# Patient Record
Sex: Female | Born: 1980 | Race: Black or African American | Hispanic: No | Marital: Single
Health system: Southern US, Community
[De-identification: ages and names within clinical notes are randomized; demographics above are authoritative.]

---

## 2013-07-26 ENCOUNTER — Ambulatory Visit: Payer: Self-pay

## 2013-07-26 LAB — URINALYSIS, COMPLETE
BILIRUBIN, UR: NEGATIVE
GLUCOSE, UR: NEGATIVE mg/dL (ref 0–75)
NITRITE: NEGATIVE
PH: 6.5 (ref 4.5–8.0)
Specific Gravity: 1.02 (ref 1.003–1.030)

## 2013-07-26 LAB — CBC WITH DIFFERENTIAL/PLATELET
Basophil #: 0.1 10*3/uL (ref 0.0–0.1)
Basophil %: 0.4 %
EOS PCT: 0.2 %
Eosinophil #: 0 10*3/uL (ref 0.0–0.7)
HCT: 36.6 % (ref 35.0–47.0)
HGB: 11.3 g/dL — ABNORMAL LOW (ref 12.0–16.0)
Lymphocyte #: 1.3 10*3/uL (ref 1.0–3.6)
Lymphocyte %: 7.3 %
MCH: 24.5 pg — AB (ref 26.0–34.0)
MCHC: 30.8 g/dL — ABNORMAL LOW (ref 32.0–36.0)
MCV: 80 fL (ref 80–100)
MONO ABS: 0.5 x10 3/mm (ref 0.2–0.9)
MONOS PCT: 2.6 %
NEUTROS PCT: 89.5 %
Neutrophil #: 16.1 10*3/uL — ABNORMAL HIGH (ref 1.4–6.5)
RBC: 4.59 10*6/uL (ref 3.80–5.20)
RDW: 17.1 % — ABNORMAL HIGH (ref 11.5–14.5)
WBC: 18 10*3/uL — AB (ref 3.6–11.0)

## 2013-07-26 LAB — COMPREHENSIVE METABOLIC PANEL
ALBUMIN: 3.9 g/dL (ref 3.4–5.0)
ALT: 19 U/L (ref 12–78)
ANION GAP: 11 (ref 7–16)
AST: 15 U/L (ref 15–37)
Alkaline Phosphatase: 84 U/L
BILIRUBIN TOTAL: 0.4 mg/dL (ref 0.2–1.0)
BUN: 8 mg/dL (ref 7–18)
CHLORIDE: 101 mmol/L (ref 98–107)
CREATININE: 0.65 mg/dL (ref 0.60–1.30)
Calcium, Total: 9.5 mg/dL (ref 8.5–10.1)
Co2: 25 mmol/L (ref 21–32)
EGFR (Non-African Amer.): >60
Glucose: 128 mg/dL — ABNORMAL HIGH (ref 65–99)
OSMOLALITY: 274 (ref 275–301)
Potassium: 3.7 mmol/L (ref 3.5–5.1)
Sodium: 137 mmol/L (ref 136–145)
TOTAL PROTEIN: 9.4 g/dL — AB (ref 6.4–8.2)

## 2013-07-26 LAB — LIPASE, BLOOD: Lipase: 97 U/L (ref 73–393)

## 2013-07-26 LAB — OCCULT BLOOD X 1 CARD TO LAB, STOOL: Occult Blood, Feces: NEGATIVE

## 2013-07-26 LAB — PREGNANCY, URINE: PREGNANCY TEST, URINE: NEGATIVE m[IU]/mL

## 2013-07-26 LAB — AMYLASE: Amylase: 117 U/L — ABNORMAL HIGH (ref 25–115)

## 2013-07-27 ENCOUNTER — Observation Stay: Payer: Self-pay | Admitting: Surgery

## 2013-07-27 LAB — GC/CHLAMYDIA PROBE AMP

## 2013-07-27 LAB — WET PREP, GENITAL

## 2013-07-28 LAB — CBC WITH DIFFERENTIAL/PLATELET
BASOS ABS: 0.1 10*3/uL (ref 0.0–0.1)
Basophil %: 0.5 %
Eosinophil #: 0.2 10*3/uL (ref 0.0–0.7)
Eosinophil %: 1.6 %
HCT: 30.8 % — ABNORMAL LOW (ref 35.0–47.0)
HGB: 9.8 g/dL — AB (ref 12.0–16.0)
LYMPHS PCT: 16.8 %
Lymphocyte #: 1.9 10*3/uL (ref 1.0–3.6)
MCH: 25.1 pg — ABNORMAL LOW (ref 26.0–34.0)
MCHC: 31.9 g/dL — AB (ref 32.0–36.0)
MCV: 79 fL — ABNORMAL LOW (ref 80–100)
MONO ABS: 1 x10 3/mm — AB (ref 0.2–0.9)
MONOS PCT: 8.9 %
NEUTROS PCT: 72.2 %
Neutrophil #: 8.2 10*3/uL — ABNORMAL HIGH (ref 1.4–6.5)
PLATELETS: 428 10*3/uL (ref 150–440)
RBC: 3.91 10*6/uL (ref 3.80–5.20)
RDW: 16.7 % — AB (ref 11.5–14.5)
WBC: 11.3 10*3/uL — ABNORMAL HIGH (ref 3.6–11.0)

## 2013-07-28 LAB — URINE CULTURE

## 2014-05-18 NOTE — Discharge Summary (Signed)
PATIENT NAME:  Delice LeschMARSHALL, Elantra N MR#:  [id number] DATE OF BIRTH:  [birth year]  DATE OF ADMISSION:  07/27/2013 DATE OF DISCHARGE:  07/28/2013  DIAGNOSES:  1.  Abdominal pain. 2.  Ovarian cysts bilaterally. 3.  Rheumatoid arthritis. 4.  Hypertension.   PROCEDURES: None.   CONSULTANTS: None.   HISTORY OF PRESENT ILLNESS AND HOSPITAL COURSE: This patient who was admitted to the hospital with right lower quadrant pain. A work-up had suggested that she had bilateral ovarian cyst, one of which may have been ruptured, but the tip of the appendix may also have been inflamed, but there was doubtful evidence that she had appendicitis. She was, however, admitted to the hospital for observation and re-examination.  On serial examinations, her pain improved. Her tenderness improved. Her white blood cell count was normal and she was discharged in stable condition, tolerating a regular diet to follow up in her gynecologist's office in 2 weeks.    ____________________________ Adah Salvageichard E. Excell Seltzerooper, MD rec:ts D: 07/28/2013 10:17:11 ET T: 07/28/2013 15:36:49 ET JOB#: [id number]  cc: Adah Salvageichard E. Excell Seltzerooper, MD, <Dictator> Lattie HawICHARD E Carols Clemence MD ELECTRONICALLY SIGNED 07/28/2013 17:02

## 2014-05-18 NOTE — H&P (Signed)
PATIENT NAME:  Ariana Frederick, Ariana Frederick MR#:  [id number] DATE OF BIRTH:  16-May-1980  DATE OF ADMISSION:  07/27/2013  PRIMARY CARE PHYSICIAN: [facility]   ADMITTING PHYSICIAN: Dr. Michela Pitcher   CHIEF COMPLAINT: Abdominal pain, nausea, vomiting.  BRIEF HISTORY:  This patient is a 34 year old woman seen in the Emergency Room with a sudden onset of right lower quadrant/right upper quadrant abdominal pain approximately 1:00 to 2:00 this afternoon.  The pain was accompanied by significant nausea and vomiting. She vomited most of her lunch.  Then began to vomit just fluid, some of which was bilious. She denied any fever but did have some sweats while she was vomiting. The pain intensified. She presented to the [facility] for further evaluation. At the [facility], white blood cell count was 18,000. She was referred to the [facility] Emergency Room for further investigation.  When she presented to the Emergency Room, most of her pain was right upper quadrant so she underwent a gallbladder ultrasound which was unremarkable.  Because of her persistent symptoms, she was then sent for a CT scan. CT scan with contrast was performed this evening. The appendix contained gas all the way through the lumen with a slight thickening and the tip measuring 7 mm to 8 mm. There was some peritoneal fluid in the right lower quadrant but no fat stranding and no evidence of any obvious air. There was a small amount of free pelvic fluid. There were bilateral adnexal cysts, thin wall 5 cm cyst in the right, and the thick wall 4 cm cyst on the left. The pelvis was difficult to evaluate because of her bilateral hip replacements. The right side did appear to contain a collapsed follicle consistent with a ruptured cyst. The patient's white blood cell count was 18,000.   She had a previous similar episode several months ago which resolved spontaneously.  It was not as severe as the current episode  and she did not undergo any work-up for it at that time. She has no other significant GI history. She has not had a previous endoscopy or colonoscopy. She has a history of hepatitis, yellow jaundice, pancreatitis, peptic ulcer disease, gallbladder disease or diverticulitis. She does have a history of severe rheumatoid arthritis, undergone bilateral hip and knee replacements. She has been on steroids as recently as March or April. She has a history of hypertension which she does not treat. She has no cardiac disease, diabetes or thyroid problems.   SOCIAL HISTORY:  She does not smoke cigarettes and does not drink alcohol regularly. She works in the [employer] at [employer].   REVIEW OF SYSTEMS:  Otherwise unremarkable.   PHYSICAL EXAMINATION:  GENERAL: She is lying comfortably in bed with no major discomfort at the present time. She has had some narcotics and she has no significant abdominal discomfort. She is febrile at 99.4. Blood pressure is 150/100, heart rate is 100.  HEENT: Unremarkable. She has no axillary or cervical adenopathy. She has midline trachea and no tenderness.  CHEST: Clear with no adventitious sounds. She has normal pulmonary excursion.  CARDIAC: She has no murmurs or gallops and she seems to be in normal sinus rhythm.  ABDOMEN: Her abdomen is for the most part soft, though she does have some point tenderness in the right lower quadrant. She has no guarding, no rebound, no masses noted. Pelvic exam is performed by the ED physician.  EXTREMITIES: Lower extremity exam reveals sequelae of her knee  and hip replacements. She has full range of motion. No joint symptoms at the present time.  PSYCHIATRIC: Normal orientation, normal affect.   ASSESSMENT AND PLAN: I have independently reviewed her CT scan. She does have air in her appendix which minimizes the possibility of appendicitis.  The only positive appendiceal finding is a slight dilatation of the tip x 1 mm over normal. There is  free fluid in the pelvis consistent with a possible ruptured ovarian cyst.  I have been notified by the ED doctor that her Trichomonas study was positive. Pelvic exam was allegedly negative for any obvious pelvic inflammatory disease. Because of the confusion in diagnosis, we will admit her to the hospital, put her on antibiotics for trichomonas, rehydrate her, and observe her course. We may involve the gynecologist if she does not appear to improve in a short order. If we continue to have reservations about the diagnosis, particularly appendicitis, we would discuss diagnostic laparoscopy. This plan has been outlined to her in detail and she is in agreement.    ____________________________ Yoshi Vicencio L. Ely III, MD rle:dCarmie Endd D: 07/27/2013 03:52:25 ET T: 07/27/2013 04:36:20 ET JOB#: [id number]  cc: Quentin Orealph L. Ely III, MD, <Dictator> Quentin OreALPH L ELY MD ELECTRONICALLY SIGNED 07/30/2013 8:57

## 2015-07-17 IMAGING — CT CT ABD-PELV W/ CM
2 of 4 series · 17 of 46 positions shown, 19 images · IV contrast (isovue)
Comparison: None.

CLINICAL DATA: Right lower quadrant pain and fever. Elevated white
count

EXAM:
CT ABDOMEN AND PELVIS WITH CONTRAST
TECHNIQUE: Multidetector CT imaging of the abdomen and pelvis was performed
using the standard protocol following bolus administration of
intravenous contrast.
CONTRAST:  100 cc Isovue 300 intravenous

[Series 2: routine abd pel with · axial · 0.69mm/px · z∈[-902,-502]mm · 14 of 88 slices shown, 16 images]
[im 4/88  soft-tissue]
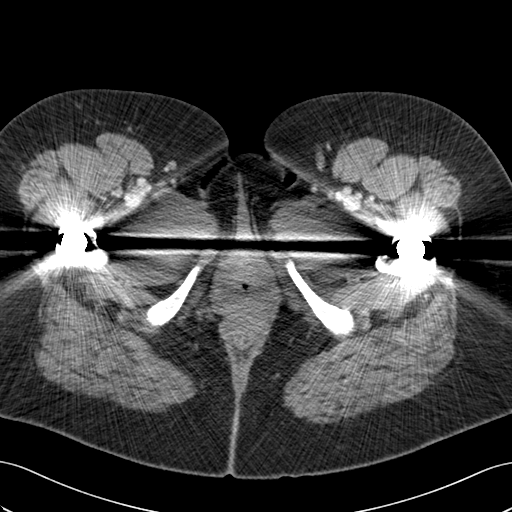
[im 4/88  bone]
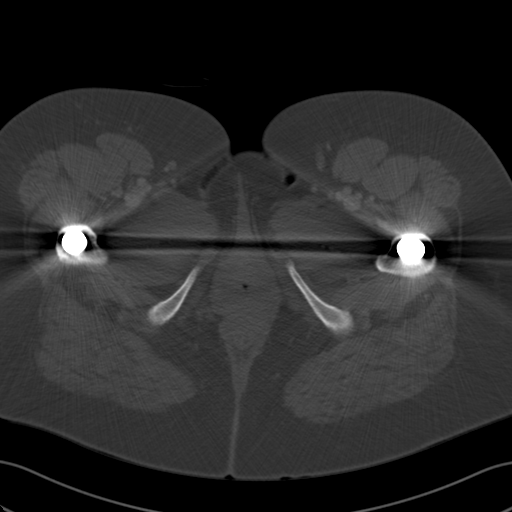
[im 11/88  soft-tissue]
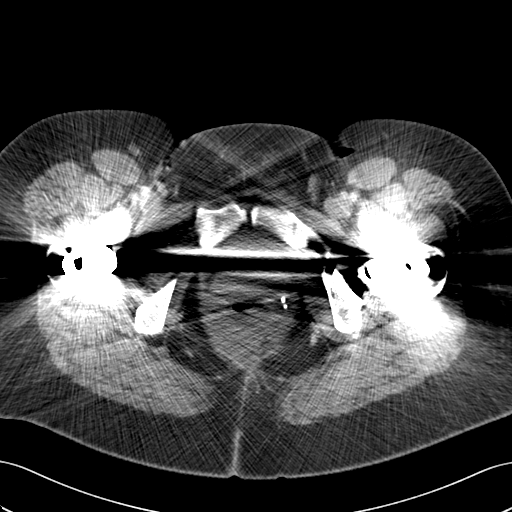
[im 17/88  soft-tissue]
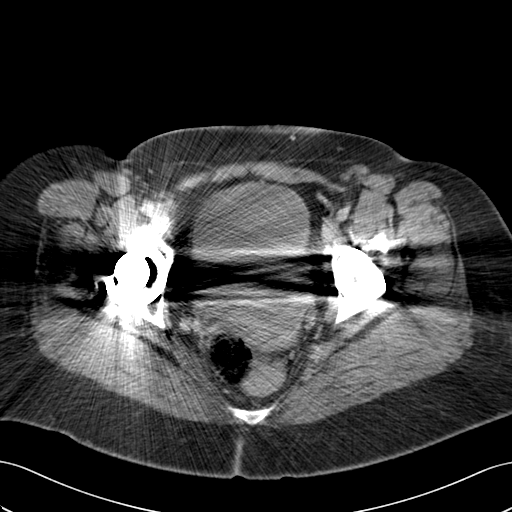
[im 24/88  soft-tissue]
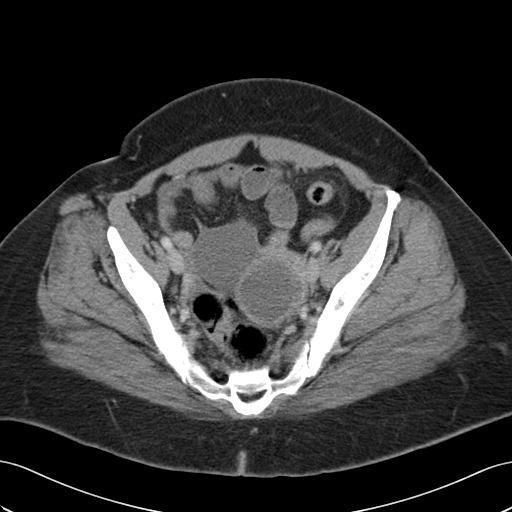
[im 31/88  soft-tissue]
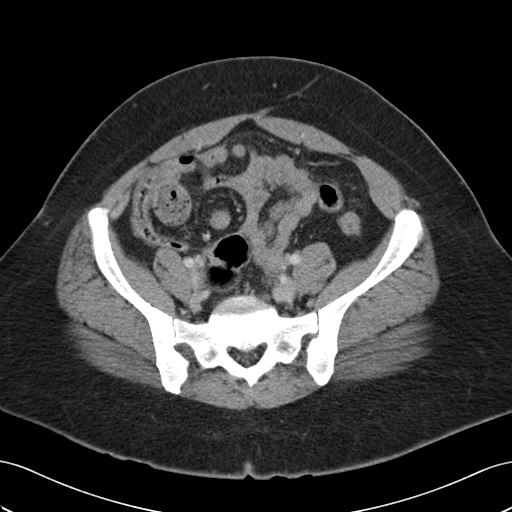
[im 34/88  soft-tissue]
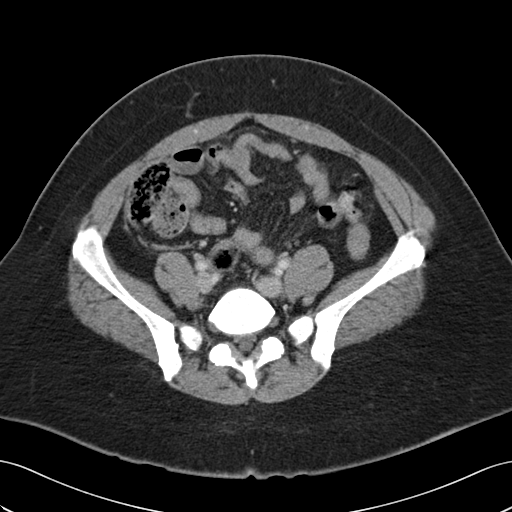
[im 41/88  soft-tissue]
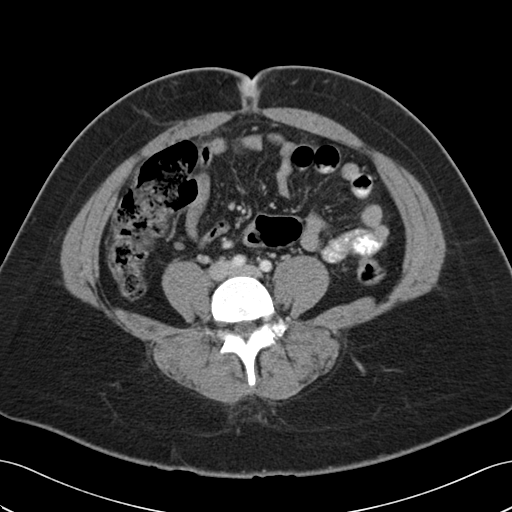
[im 47/88  soft-tissue]
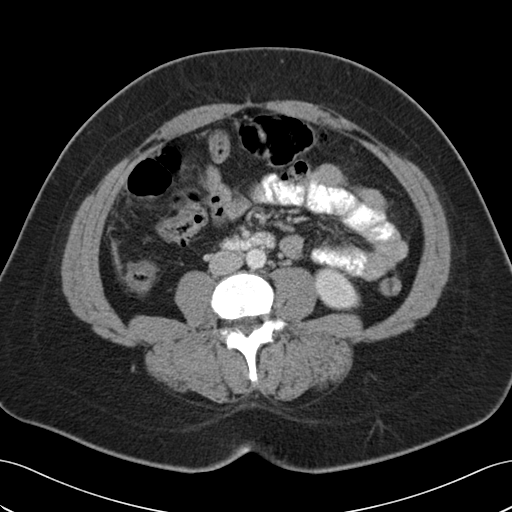
[im 54/88  soft-tissue]
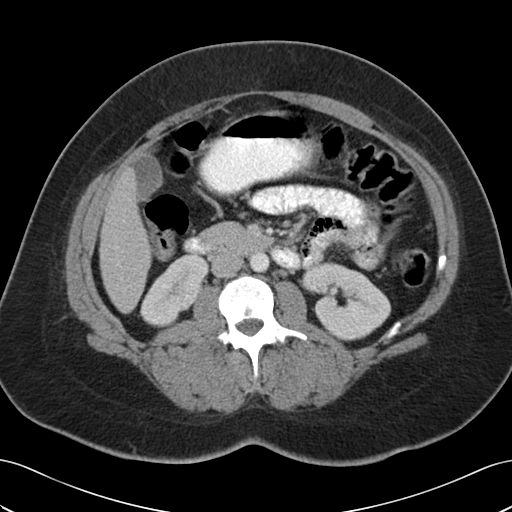
[im 54/88  bone]
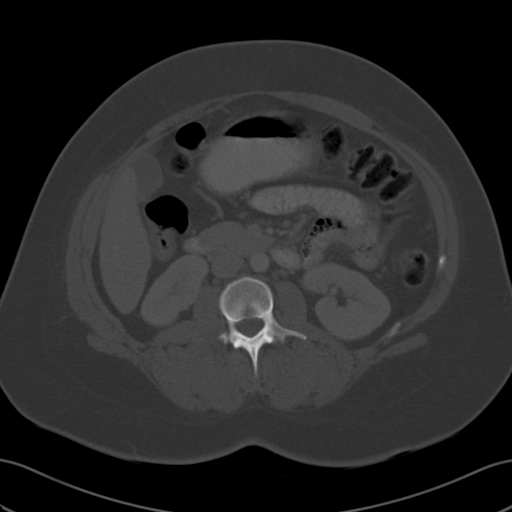
[im 57/88  soft-tissue]
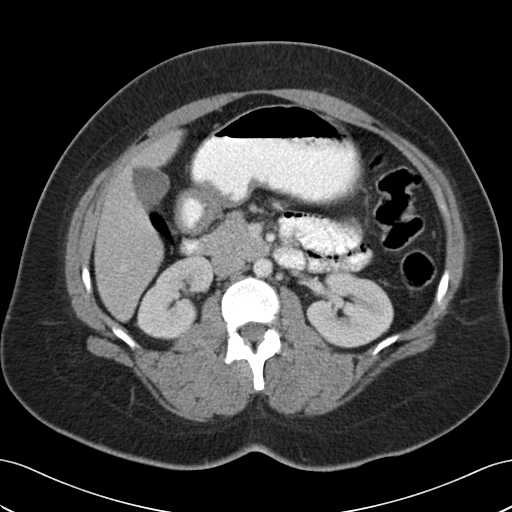
[im 64/88  soft-tissue]
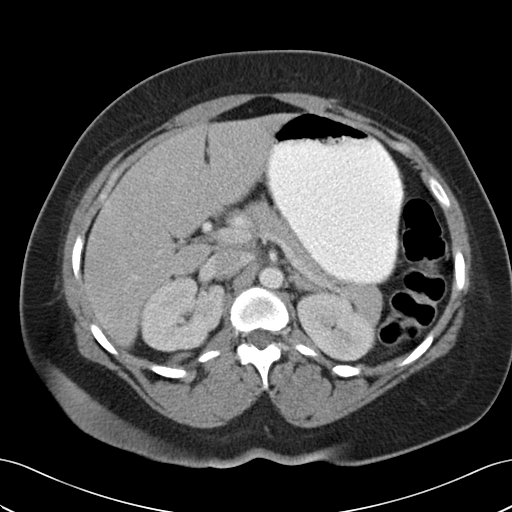
[im 71/88  soft-tissue]
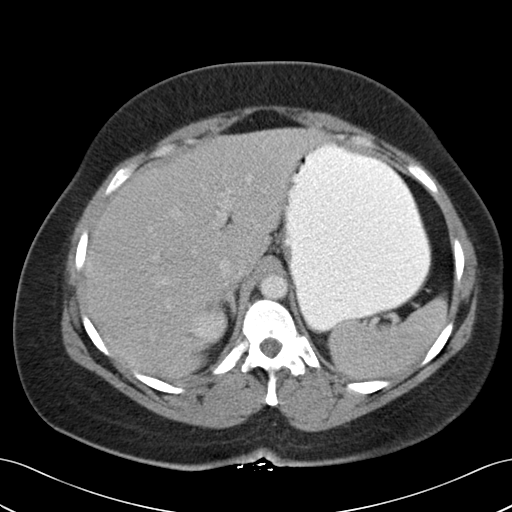
[im 77/88  soft-tissue]
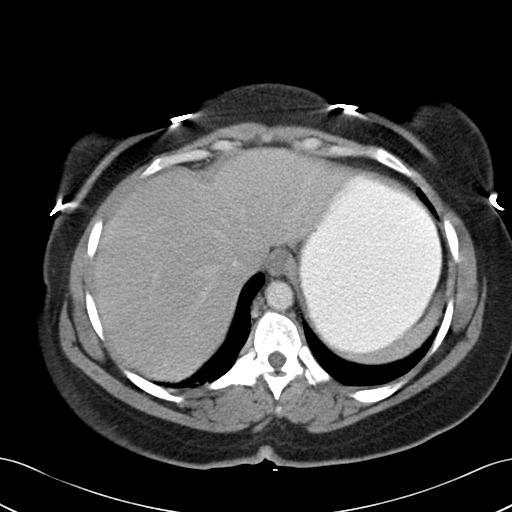
[im 84/88  soft-tissue]
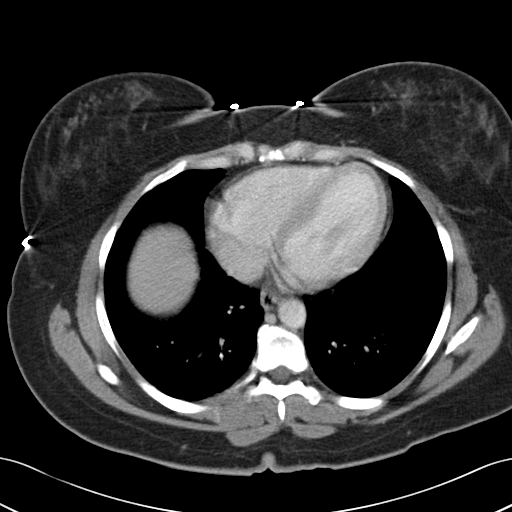

[Series 5: cor routine abd pel with · coronal · 0.89mm/px · 3 of 133 slices shown]
[im 45/133  soft-tissue]
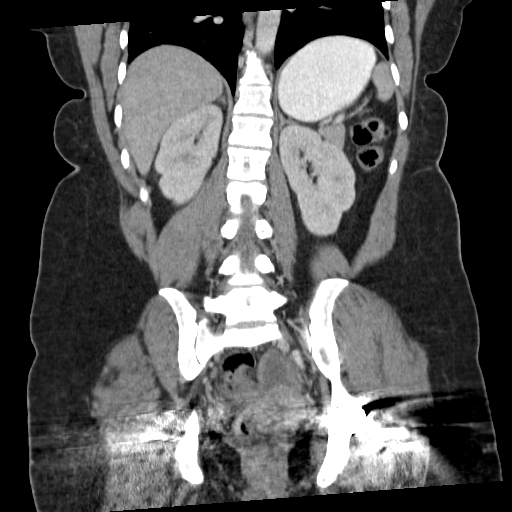
[im 59/133  soft-tissue]
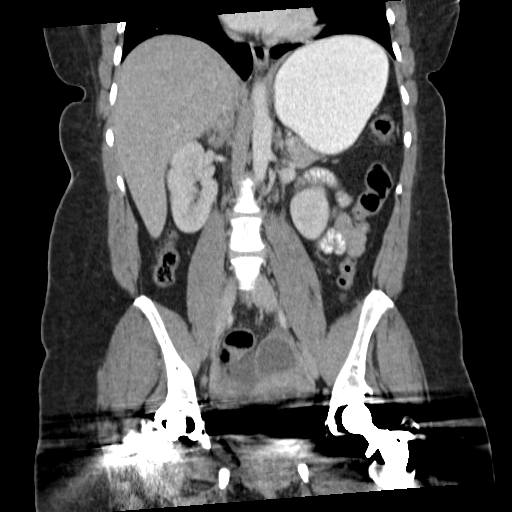
[im 74/133  soft-tissue]
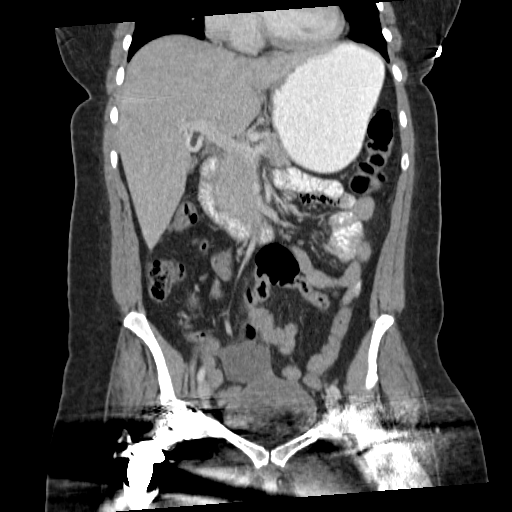

[17 of 46 positions shown; findings below may reference images not displayed]

FINDINGS: BODY WALL: Unremarkable.

LOWER CHEST: Unremarkable.

ABDOMEN/PELVIS:

Liver: No focal abnormality.

Biliary: No evidence of biliary obstruction or stone.

Pancreas: Unremarkable.

Spleen: Unremarkable.

Adrenals: Unremarkable.

Kidneys and ureters: No hydronephrosis or stone.

Bladder: The lower bladder and distal ureters is obscured by streak
artifact.

Reproductive: There are bilateral adnexal cysts. A thin walled 5 cm
cyst arises from the right, and a thick walled 4 cm cyst from the
left. The right ovarian parenchyma is posterior to the above
described cyst, and may contain a collapsed follicle. Portions of
the uterus are obscured by hip prostheses.

Bowel: The majority of the appendix is normal, containing gas within
the lumen. There is however slight thickening of the tip, measuring
7-8 mm. There is peritoneal fluid in the right lower quadrant, which
encompasses the tip, but no definite fat stranding. No bowel
obstruction.

Peritoneum: Small volume free pelvic fluid

Vascular: No acute abnormality.

OSSEOUS: Bilateral total hip arthroplasty. The imaged portions are
without adverse feature.
IMPRESSION: 1. Pelvic fluid and bilateral adnexal cysts. Given the leukocytosis,
suggest workup for pelvic inflammatory disease. If there is
unilateral pain, a pelvic ultrasound could document ovarian flow.
2. Appendicitis is considered unlikely, but there is mild distention
of the appendiceal tip. A follow-up examination could be obtained if
there is progressive, unexplained abdominal pain.
